# Patient Record
Sex: Male | Born: 1950 | Race: White | Hispanic: No | Marital: Single
Health system: Southern US, Community
[De-identification: ages and names within clinical notes are randomized; demographics above are authoritative.]

## PROBLEM LIST (undated history)

## (undated) DIAGNOSIS — I10 Essential (primary) hypertension: Secondary | ICD-10-CM

## (undated) DIAGNOSIS — F0390 Unspecified dementia without behavioral disturbance: Secondary | ICD-10-CM

---

## 2015-11-06 ENCOUNTER — Emergency Department (HOSPITAL_COMMUNITY): Payer: Medicare Other

## 2015-11-06 ENCOUNTER — Emergency Department (HOSPITAL_COMMUNITY)
Admission: EM | Admit: 2015-11-06 | Discharge: 2015-11-06 | Disposition: A | Discharge status: Home / Self Care | Payer: Medicare Other | Attending: Emergency Medicine | Admitting: Emergency Medicine

## 2015-11-06 ENCOUNTER — Encounter (HOSPITAL_COMMUNITY): Payer: Self-pay | Admitting: *Deleted

## 2015-11-06 DIAGNOSIS — F0391 Unspecified dementia with behavioral disturbance: Secondary | ICD-10-CM | POA: Diagnosis not present

## 2015-11-06 DIAGNOSIS — F1210 Cannabis abuse, uncomplicated: Secondary | ICD-10-CM | POA: Diagnosis not present

## 2015-11-06 DIAGNOSIS — I10 Essential (primary) hypertension: Secondary | ICD-10-CM | POA: Diagnosis not present

## 2015-11-06 DIAGNOSIS — F0390 Unspecified dementia without behavioral disturbance: Secondary | ICD-10-CM | POA: Diagnosis present

## 2015-11-06 HISTORY — DX: Unspecified dementia, unspecified severity, without behavioral disturbance, psychotic disturbance, mood disturbance, and anxiety: F03.90

## 2015-11-06 HISTORY — DX: Essential (primary) hypertension: I10

## 2015-11-06 LAB — COMPREHENSIVE METABOLIC PANEL
ALBUMIN: 3.8 g/dL (ref 3.5–5.0)
ALT: 12 U/L — ABNORMAL LOW (ref 17–63)
AST: 21 U/L (ref 15–41)
Alkaline Phosphatase: 104 U/L (ref 38–126)
Anion gap: 12 (ref 5–15)
BILIRUBIN TOTAL: 1 mg/dL (ref 0.3–1.2)
BUN: 24 mg/dL — AB (ref 6–20)
CHLORIDE: 104 mmol/L (ref 101–111)
CO2: 23 mmol/L (ref 22–32)
Calcium: 9.5 mg/dL (ref 8.9–10.3)
Creatinine, Ser: 1.7 mg/dL — ABNORMAL HIGH (ref 0.61–1.24)
GFR calc Af Amer: 48 mL/min — ABNORMAL LOW (ref >60)
GFR calc non Af Amer: 41 mL/min — ABNORMAL LOW (ref >60)
GLUCOSE: 145 mg/dL — AB (ref 65–99)
POTASSIUM: 3.5 mmol/L (ref 3.5–5.1)
SODIUM: 139 mmol/L (ref 135–145)
Total Protein: 7.7 g/dL (ref 6.5–8.1)

## 2015-11-06 LAB — URINE MICROSCOPIC-ADD ON: SQUAMOUS EPITHELIAL / LPF: NONE SEEN

## 2015-11-06 LAB — RAPID URINE DRUG SCREEN, HOSP PERFORMED
AMPHETAMINES: NOT DETECTED
BARBITURATES: NOT DETECTED
Benzodiazepines: NOT DETECTED
Cocaine: NOT DETECTED
Opiates: NOT DETECTED
TETRAHYDROCANNABINOL: POSITIVE — AB

## 2015-11-06 LAB — CBC WITH DIFFERENTIAL/PLATELET
BASOS ABS: 0 10*3/uL (ref 0.0–0.1)
BASOS PCT: 0 %
Eosinophils Absolute: 0 10*3/uL (ref 0.0–0.7)
Eosinophils Relative: 0 %
HEMATOCRIT: 46.9 % (ref 39.0–52.0)
Hemoglobin: 16.1 g/dL (ref 13.0–17.0)
Lymphocytes Relative: 7 %
Lymphs Abs: 1.1 10*3/uL (ref 0.7–4.0)
MCH: 32.7 pg (ref 26.0–34.0)
MCHC: 34.3 g/dL (ref 30.0–36.0)
MCV: 95.3 fL (ref 78.0–100.0)
MONO ABS: 0.6 10*3/uL (ref 0.1–1.0)
Monocytes Relative: 4 %
NEUTROS ABS: 13.5 10*3/uL — AB (ref 1.7–7.7)
Neutrophils Relative %: 89 %
PLATELETS: 414 10*3/uL — AB (ref 150–400)
RBC: 4.92 MIL/uL (ref 4.22–5.81)
RDW: 13.3 % (ref 11.5–15.5)
WBC: 15.1 10*3/uL — ABNORMAL HIGH (ref 4.0–10.5)

## 2015-11-06 LAB — URINALYSIS, ROUTINE W REFLEX MICROSCOPIC
Glucose, UA: 100 mg/dL — AB
KETONES UR: NEGATIVE mg/dL
Leukocytes, UA: NEGATIVE
NITRITE: NEGATIVE
Protein, ur: >300 mg/dL — AB
Specific Gravity, Urine: 1.023 (ref 1.005–1.030)
pH: 6 (ref 5.0–8.0)

## 2015-11-06 NOTE — ED Notes (Signed)
This tech called and left message for son Aurelio BrashJoey to come and pick up father @ [phone]. Requested for him to call back with possible ETA.
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
## 2015-11-06 NOTE — ED Notes (Signed)
I have just been informed that his sone will be coming to pick him up as he has been medically cleared.  He remains in no distress.
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
## 2015-11-06 NOTE — ED Notes (Signed)
Bed: [facility] Expected date:  Expected time:  Means of arrival:  Comments: 
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
## 2015-11-06 NOTE — ED Notes (Signed)
He remains in no distress.  Our doctor Verdie Mosher(Liu) has spoken with his son--disposition still uncertain at this time.  He is given a second meal tray at this time for which he thanks us.

## 2015-11-06 NOTE — Discharge Instructions (Signed)
Please bring your father back to the ED for worsening symptoms, including if he is increasingly confused, has fever, vomiting and unable to keep down food/fluids, or any other symptoms concerning to you. Please have your father see his primary care provider in 2-3 days for re-evaluation.  Dementia Dementia is a general term for problems with brain function. A person with dementia has memory loss and a hard time with at least one other brain function such as thinking, speaking, or problem solving. Dementia can affect social functioning, how you do your job, your mood, or your personality. The changes may be hidden for a long time. The earliest forms of this disease are usually not detected by family or friends. Dementia can be:  Irreversible.  Potentially reversible.  Partially reversible.  Progressive. This means it can get worse over time. CAUSES  Irreversible dementia causes may include:  Degeneration of brain cells (Alzheimer disease or Lewy body dementia).  Multiple small strokes (vascular dementia).  Infection (chronic meningitis or Creutzfeldt-Jakob disease).  Frontotemporal dementia. This affects younger people, age 64 to 3970, compared to those who have Alzheimer disease.  Dementia associated with other disorders like Parkinson disease, Huntington disease, or HIV-associated dementia. Potentially or partially reversible dementia causes may include:  Medicines.  Metabolic causes such as excessive alcohol intake, vitamin B12 deficiency, or thyroid disease.  Masses or pressure in the brain such as a tumor, blood clot, or hydrocephalus. SIGNS AND SYMPTOMS  Symptoms are often hard to detect. Family members or coworkers may not notice them early in the disease process. Different people with dementia may have different symptoms. Symptoms can include:  A hard time with memory, especially recent memory. Long-term memory may not be impaired.  Asking the same question multiple times or  forgetting something someone just said.  A hard time speaking your thoughts or finding certain words.  A hard time solving problems or performing familiar tasks (such as how to use a telephone).  Sudden changes in mood.  Changes in personality, especially increasing moodiness or mistrust.  Depression.  A hard time understanding complex ideas that were never a problem in the past. DIAGNOSIS  There are no specific tests for dementia.   Your health care provider may recommend a thorough evaluation. This is because some forms of dementia can be reversible. The evaluation will likely include a physical exam and getting a detailed history from you and a family member. The history often gives the best clues and suggestions for a diagnosis.  Memory testing may be done. A detailed brain function evaluation called neuropsychologic testing may be helpful.  Lab tests and brain imaging (such as a CT scan or MRI scan) are sometimes important.  Sometimes observation and re-evaluation over time is very helpful. TREATMENT  Treatment depends on the cause.   If the problem is a vitamin deficiency, it may be helped or cured with supplements.  For dementias such as Alzheimer disease, medicines are available to stabilize or slow the course of the disease. There are no cures for this type of dementia.  Your health care provider can help direct you to groups, organizations, and other health care providers to help with decisions in the care of you or your loved one. HOME CARE INSTRUCTIONS The care of individuals with dementia is varied and dependent upon the progression of the dementia. The following suggestions are intended for the person living with, or caring for, the person with dementia.  Create a safe environment.  Remove the locks on  bathroom doors to prevent the person from accidentally locking himself or herself in.  Use childproof latches on kitchen cabinets and any place where cleaning  supplies, chemicals, or alcohol are kept.  Use childproof covers in unused electrical outlets.  Install childproof devices to keep doors and windows secured.  Remove stove knobs or install safety knobs and an automatic shut-off on the stove.  Lower the temperature on water heaters.  Label medicines and keep them locked up.  Secure knives, lighters, matches, power tools, and guns, and keep these items out of reach.  Keep the house free from clutter. Remove rugs or anything that might contribute to a fall.  Remove objects that might break and hurt the person.  Make sure lighting is good, both inside and outside.  Install grab rails as needed.  Use a monitoring device to alert you to falls or other needs for help.  Reduce confusion.  Keep familiar objects and people around.  Use night lights or dim lights at night.  Label items or areas.  Use reminders, notes, or directions for daily activities or tasks.  Keep a simple, consistent routine for waking, meals, bathing, dressing, and bedtime.  Create a calm, quiet environment.  Place large clocks and calendars prominently.  Display emergency numbers and home address near all telephones.  Use cues to establish different times of the day. An example is to open curtains to let the natural light in during the day.   Use effective communication.  Choose simple words and short sentences.  Use a gentle, calm tone of voice.  Be careful not to interrupt.  If the person is struggling to find a word or communicate a thought, try to provide the word or thought.  Ask one question at a time. Allow the person ample time to answer questions. Repeat the question again if the person does not respond.  Reduce nighttime restlessness.  Provide a comfortable bed.  Have a consistent nighttime routine.  Ensure a regular walking or physical activity schedule. Involve the person in daily activities as much as possible.  Limit napping  during the day.  Limit caffeine.  Attend social events that stimulate rather than overwhelm the senses.  Encourage good nutrition and hydration.  Reduce distractions during meal times and snacks.  Avoid foods that are too hot or too cold.  Monitor chewing and swallowing ability.  Continue with routine vision, hearing, dental, and medical screenings.  Give medicines only as directed by the health care provider.  Monitor driving abilities. Do not allow the person to drive when safe driving is no longer possible.  Register with an identification program which could provide location assistance in the event of a missing person situation. SEEK MEDICAL CARE IF:   New behavioral problems start such as moodiness, aggressiveness, or seeing things that are not there (hallucinations).  Any new problem with brain function happens. This includes problems with balance, speech, or falling a lot.  Problems with swallowing develop.  Any symptoms of other illness happen. Small changes or worsening in any aspect of brain function can be a sign that the illness is getting worse. It can also be a sign of another medical illness such as infection. Seeing a health care provider right away is important. SEEK IMMEDIATE MEDICAL CARE IF:   A fever develops.  New or worsened confusion develops.  New or worsened sleepiness develops.  Staying awake becomes hard to do.   This information is not intended to replace advice given to  you by your health care provider. Make sure you discuss any questions you have with your health care provider.   Document Released: 04/03/2001 Document Revised: 10/29/2014 Document Reviewed: 03/05/2011 Elsevier Interactive Patient Education [employer].

## 2016-12-10 IMAGING — CT CT HEAD W/O CM
2 series · 16 of 30 positions shown, 20 images · non-contrast
Comparison: None

CLINICAL DATA: Altered mental status, confusion, history
Alzheimer's, hypertension

EXAM:
CT HEAD WITHOUT CONTRAST
TECHNIQUE: Contiguous axial images were obtained from the base of the skull
through the vertex without intravenous contrast.

[Series 2: head w/o · axial · non-contrast · 0.45mm/px · z∈[-172,-52]mm · 13 of 30 slices shown, 17 images]
[im 3/30  brain]
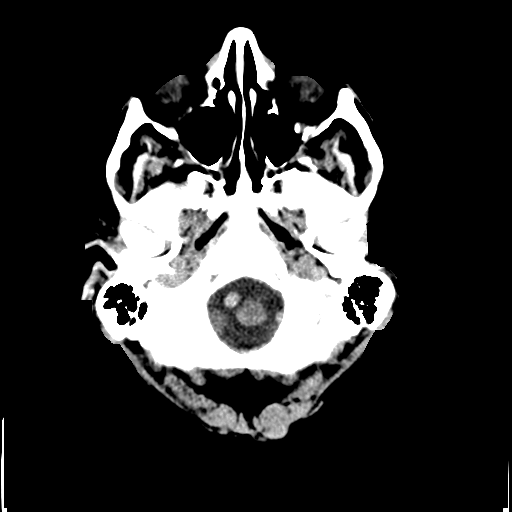
[im 3/30  bone]
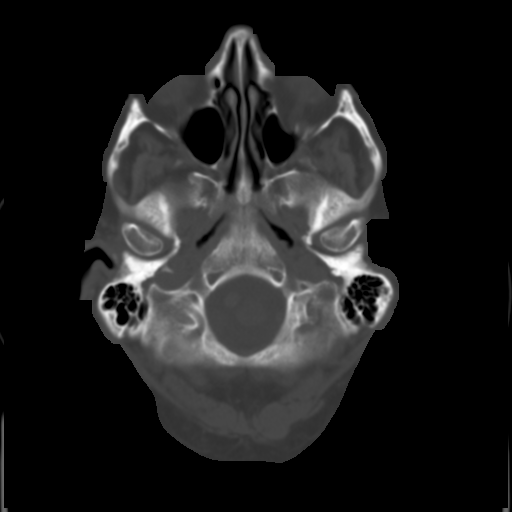
[im 5/30  brain]
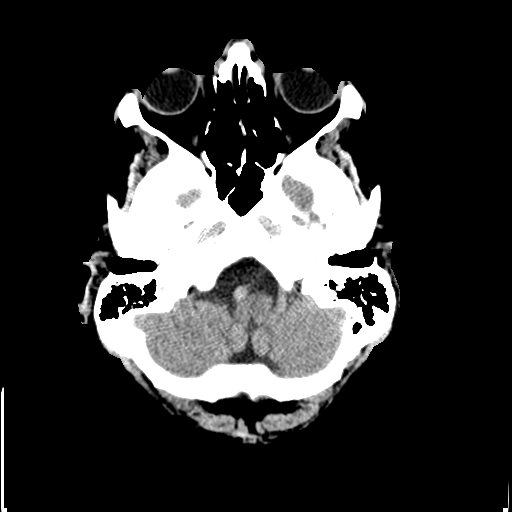
[im 7/30  brain]
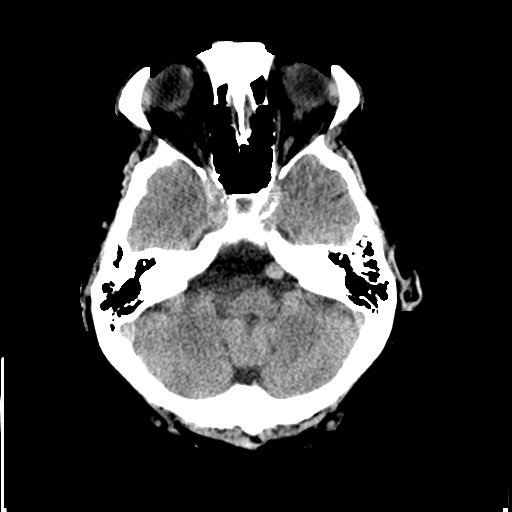
[im 9/30  brain]
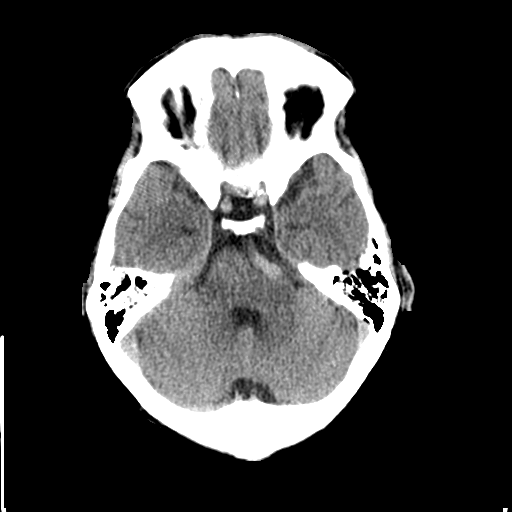
[im 11/30  brain]
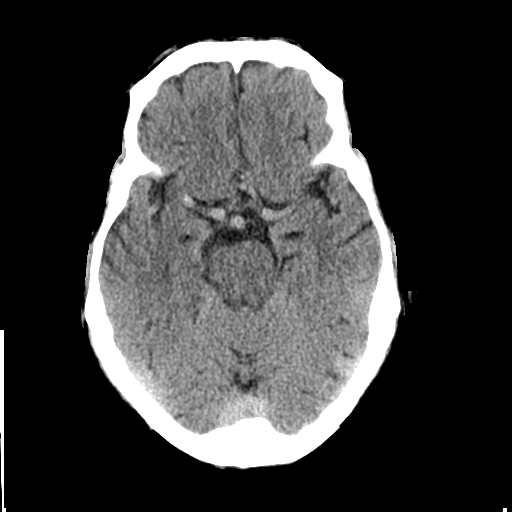
[im 11/30  bone]
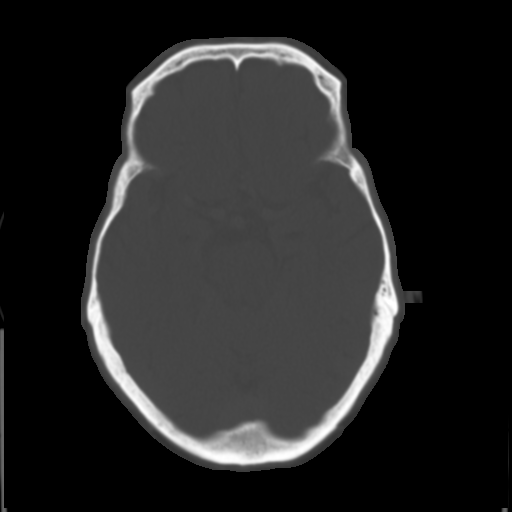
[im 13/30  brain]
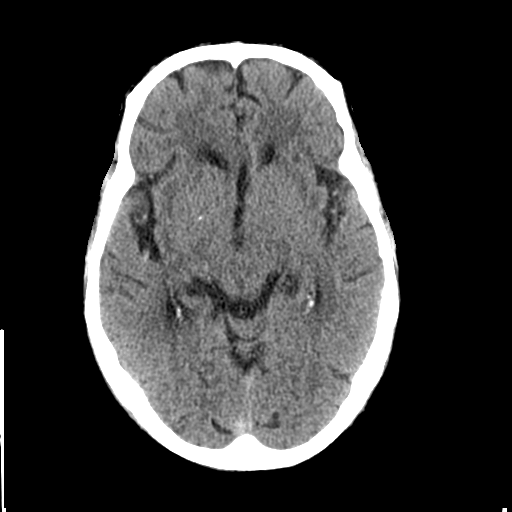
[im 15/30  brain]
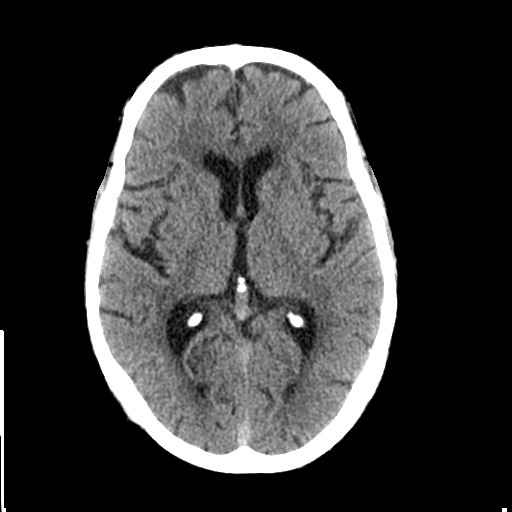
[im 17/30  brain]
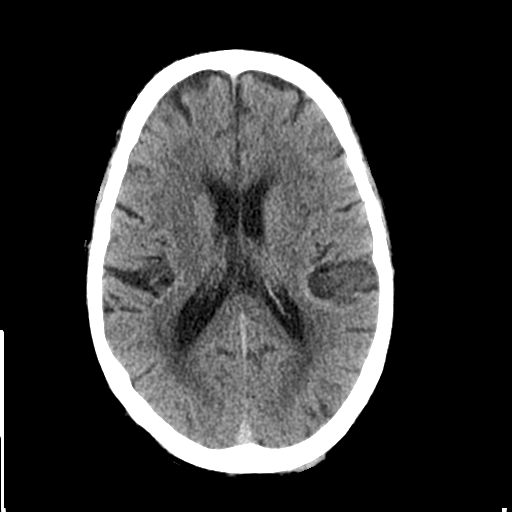
[im 19/30  brain]
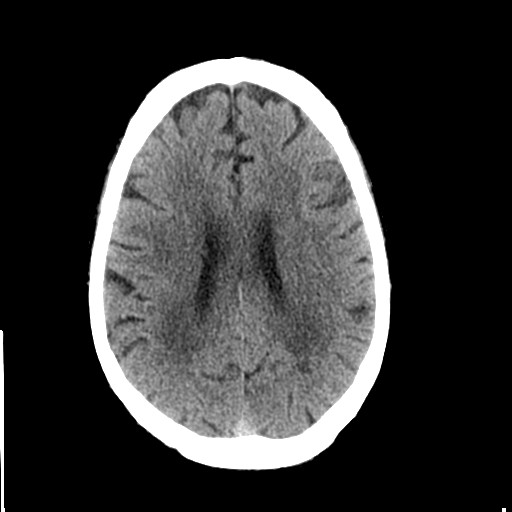
[im 19/30  bone]
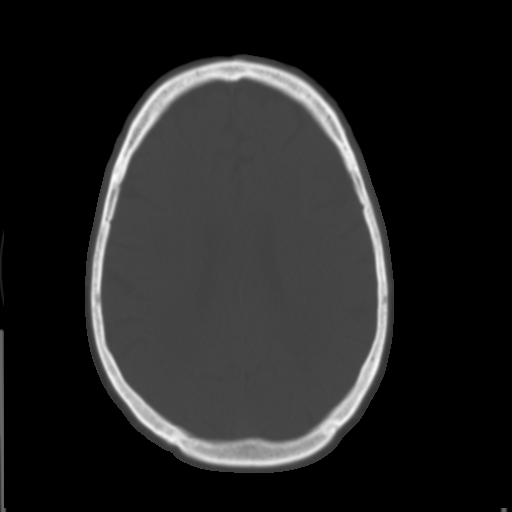
[im 21/30  brain]
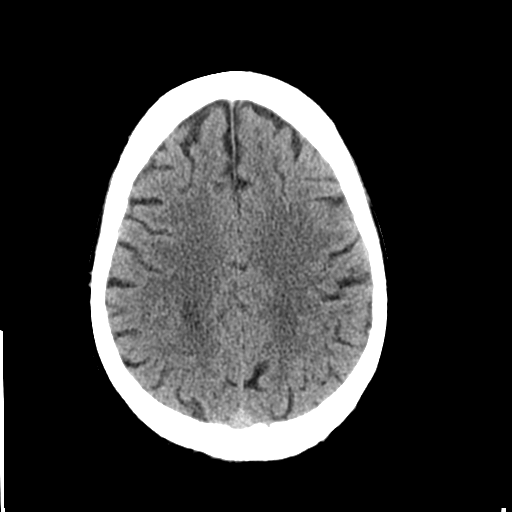
[im 23/30  brain]
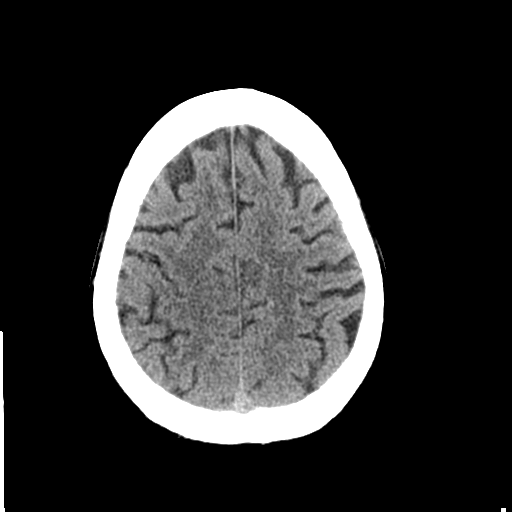
[im 25/30  brain]
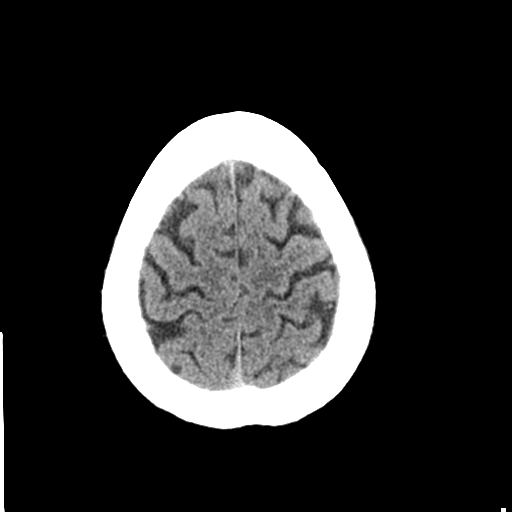
[im 27/30  brain]
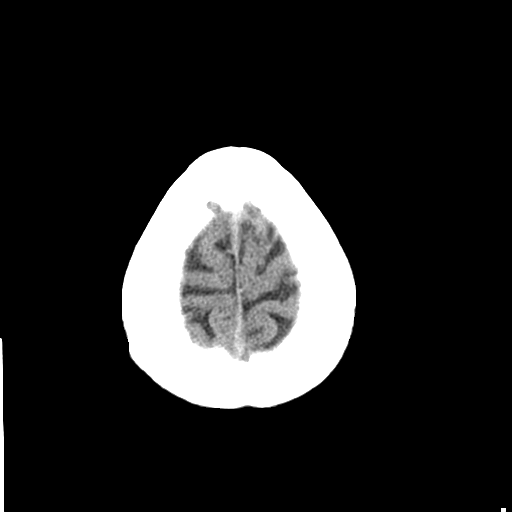
[im 27/30  bone]
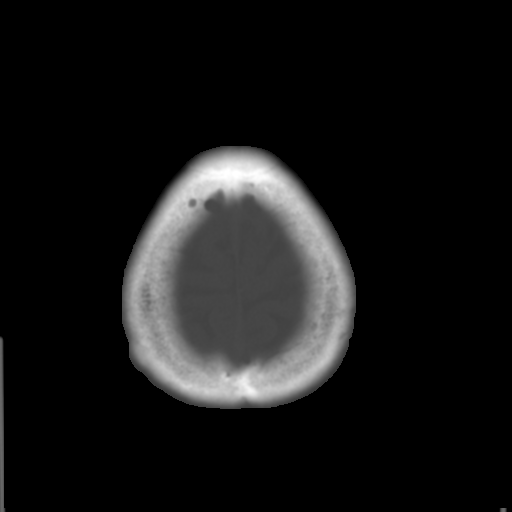

[Series 3: bone windows · axial · 0.45mm/px · z∈[-172,-132]mm · 3 of 30 slices shown]
[im 3/30  bone]
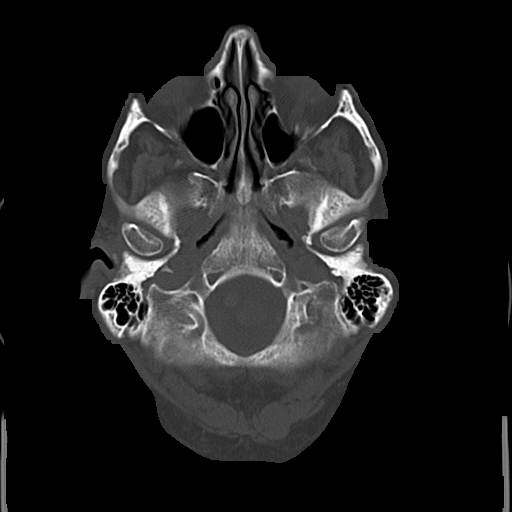
[im 7/30  bone]
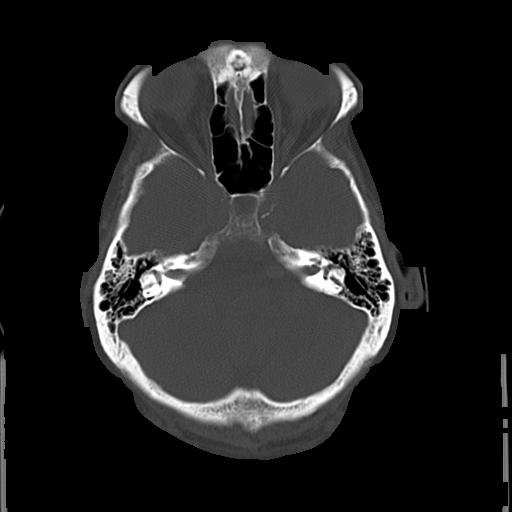
[im 11/30  bone]
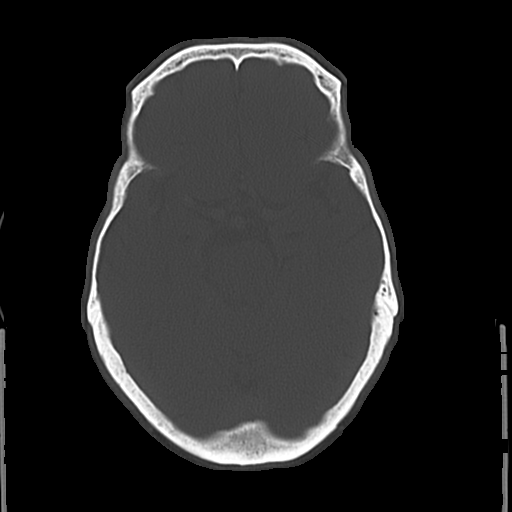

[16 of 30 positions shown; findings below may reference images not displayed]

FINDINGS: Generalized atrophy.

Normal ventricular morphology.

No midline shift or mass effect.

Small vessel chronic ischemic changes of deep cerebral white matter.

No intracranial hemorrhage, mass lesion, or acute infarction.

Visualized paranasal sinuses and mastoid air cells clear.

Bones unremarkable.

Dolichoectasia of the LEFT vertebral artery.

Minimally prominent sizes diffusely of the internal carotid and LEFT
vertebral arteries.
IMPRESSION: Atrophy with small vessel chronic ischemic changes of deep cerebral
white matter.

No acute intracranial abnormalities.

LEFT vertebral artery dolichoectasia.
